# Patient Record
Sex: Female | Born: 1960 | Race: White | Hispanic: No | Smoking: Never smoker
Health system: Southern US, Community
[De-identification: ages and names within clinical notes are randomized; demographics above are authoritative.]

## PROBLEM LIST (undated history)

## (undated) DIAGNOSIS — M1990 Unspecified osteoarthritis, unspecified site: Secondary | ICD-10-CM

## (undated) DIAGNOSIS — F32A Depression, unspecified: Secondary | ICD-10-CM

## (undated) DIAGNOSIS — F329 Major depressive disorder, single episode, unspecified: Secondary | ICD-10-CM

## (undated) DIAGNOSIS — I10 Essential (primary) hypertension: Secondary | ICD-10-CM

## (undated) DIAGNOSIS — F419 Anxiety disorder, unspecified: Secondary | ICD-10-CM

## (undated) DIAGNOSIS — G709 Myoneural disorder, unspecified: Secondary | ICD-10-CM

## (undated) HISTORY — PX: TUBAL LIGATION: SHX77

## (undated) HISTORY — PX: TONSILLECTOMY: SUR1361

## (undated) HISTORY — PX: APPENDECTOMY: SHX54

## (undated) HISTORY — PX: CHOLECYSTECTOMY: SHX55

---

## 2016-05-08 ENCOUNTER — Other Ambulatory Visit: Payer: Self-pay | Admitting: Obstetrics and Gynecology

## 2016-05-08 DIAGNOSIS — Z1231 Encounter for screening mammogram for malignant neoplasm of breast: Secondary | ICD-10-CM

## 2016-05-08 DIAGNOSIS — R928 Other abnormal and inconclusive findings on diagnostic imaging of breast: Secondary | ICD-10-CM

## 2016-05-21 ENCOUNTER — Ambulatory Visit (HOSPITAL_COMMUNITY)
Admission: RE | Admit: 2016-05-21 | Discharge: 2016-05-21 | Disposition: A | Discharge status: Home / Self Care | Payer: Self-pay | Source: Ambulatory Visit | Attending: Obstetrics and Gynecology | Admitting: Obstetrics and Gynecology

## 2016-05-21 ENCOUNTER — Ambulatory Visit
Admission: RE | Admit: 2016-05-21 | Discharge: 2016-05-21 | Disposition: A | Discharge status: Home / Self Care | Payer: No Typology Code available for payment source | Source: Ambulatory Visit | Attending: Obstetrics and Gynecology | Admitting: Obstetrics and Gynecology

## 2016-05-21 VITALS — BP 135/81 | HR 74 | Temp 98.6°F | Ht 66.0 in | Wt 199.4 lb

## 2016-05-21 DIAGNOSIS — Z029 Encounter for administrative examinations, unspecified: Secondary | ICD-10-CM | POA: Insufficient documentation

## 2016-05-21 DIAGNOSIS — Z1239 Encounter for other screening for malignant neoplasm of breast: Secondary | ICD-10-CM

## 2016-05-21 DIAGNOSIS — R87613 High grade squamous intraepithelial lesion on cytologic smear of cervix (HGSIL): Secondary | ICD-10-CM

## 2016-05-21 DIAGNOSIS — Z1231 Encounter for screening mammogram for malignant neoplasm of breast: Secondary | ICD-10-CM

## 2016-05-21 NOTE — Patient Instructions (Signed)
Explained breast self awareness with Satira SarkLaura Goldner. Patient did not need a Pap smear today due to last Pap smear was 04/17/2016. Explained the colposcopy the recommended follow up for patient's abnormal Pap smear. [facility]ps[facility]. Appointment scheduled for Thursday, May 30, 2016 at 1320. [facility]. Appointment scheduled for Tuesday, May 22, 2014 at 1140. Patient aware of colposcopy appointment and will be there or call to reschedule if needed. Let patient know the Breast Center will follow up with her within the next couple weeks with results of mammogram by letter or phone. Satira SarkLaura Kobel verbalized understanding.  Banks Chaikin, Kathaleen Maserhristine Poll, RN 1:00 PM

## 2016-05-21 NOTE — Addendum Note (Signed)
Encounter addended by: Priscille HeidelbergBrannock, Almendra Loria P, RN on: 05/21/2016  1:11 PM<BR>    Actions taken: Allergies modified

## 2016-05-21 NOTE — Progress Notes (Signed)
[facility] due to having an abnormal Pap smear 04/17/2016 that a colposcopy is recommended for follow up.  Pap Smear: Pap smear not completed today. [facility] and [facility]. [facility] for a colpscopy. Appointment scheduled for Thursday, May 30, 2016 at 1320. Per patient has no history of abnormal Pap smears prior to the most recent Pap smear. Last Pap smear result is in EPIC.  Physical exam: Breasts Breasts symmetrical. No skin abnormalities bilateral breasts. No nipple retraction bilateral breasts. No nipple discharge bilateral breasts. No lymphadenopathy. No lumps palpated bilateral breasts. No complaints of pain or tenderness on exam. [facility] screening mamm[facility]. Appointment scheduled for Tuesday, May 22, 2014 at 1140.        Pelvic/Bimanual No Pap smear completed today since last Pap smear was 04/17/2016. Pap smear not indicated per [facility] guidelines.   Smoking History: Patient has never smoked.  Patient Navigation: Patient education provided. Access to services provided for patient through [facility] program.   Colorectal Cancer Screening: Per patient has never had a colonoscopy completed. No complaints today. FIT Test given to patient to complete and return to [facility].

## 2016-05-22 ENCOUNTER — Encounter (HOSPITAL_COMMUNITY): Payer: Self-pay

## 2016-05-22 ENCOUNTER — Encounter (HOSPITAL_COMMUNITY): Payer: Self-pay | Admitting: *Deleted

## 2016-05-22 NOTE — Addendum Note (Signed)
Encounter addended by: Lynnell DikeHolland, Ahlivia Salahuddin H, LPN on: 1/6/10965/09/2016 10:32 AM<BR>    Actions taken: Vitals modified

## 2016-05-30 ENCOUNTER — Ambulatory Visit: Payer: Self-pay | Admitting: Obstetrics and Gynecology

## 2016-07-09 ENCOUNTER — Ambulatory Visit (INDEPENDENT_AMBULATORY_CARE_PROVIDER_SITE_OTHER): Payer: Self-pay | Admitting: Obstetrics & Gynecology

## 2016-07-09 ENCOUNTER — Encounter: Payer: Self-pay | Admitting: Obstetrics & Gynecology

## 2016-07-09 ENCOUNTER — Other Ambulatory Visit (HOSPITAL_COMMUNITY)
Admission: RE | Admit: 2016-07-09 | Discharge: 2016-07-09 | Disposition: A | Discharge status: Home / Self Care | Payer: Medicaid Other | Source: Ambulatory Visit | Attending: Obstetrics & Gynecology | Admitting: Obstetrics & Gynecology

## 2016-07-09 VITALS — BP 150/88 | HR 73 | Ht 67.0 in | Wt 205.6 lb

## 2016-07-09 DIAGNOSIS — D069 Carcinoma in situ of cervix, unspecified: Secondary | ICD-10-CM | POA: Diagnosis not present

## 2016-07-09 DIAGNOSIS — R87613 High grade squamous intraepithelial lesion on cytologic smear of cervix (HGSIL): Secondary | ICD-10-CM

## 2016-07-09 LAB — POCT PREGNANCY, URINE: PREG TEST UR: NEGATIVE

## 2016-07-09 NOTE — Patient Instructions (Signed)
Human Papillomavirus Human papillomavirus (HPV) is the most common sexually transmitted infection (STI). It is easy to pass it from person to person (contagious). HPV can cause cervical cancer, anal cancer, and genital warts. The genital warts can be seen and felt. Also, there may be wartlike regions in the throat. HPV may not have any symptoms. It is possible to have HPV for a long time and not know it. You may pass HPV on to others without knowing it. Follow these instructions at home:  Take medicines as told by your doctor.  Use over-the-counter creams for itching as told by your doctor.  Keep all follow-up visits. Make sure to get Pap tests as told by your doctor.  Do not touch or scratch the warts.  Do not treat genital warts with medicines used for treating hand warts.  Do not have sex while you are getting treatment.  Do not douche or use tampons during treatment of HPV.  Tell your sex partner about your infection because he or she may also need treatment.  If you get pregnant, tell your doctor that you had HPV. Your doctor will watch your pregnancy closely. This is important to keep your baby safe.  After treatment, use condoms during sex to prevent future infections.  Have only one sex partner.  Have a sex partner who does not have other sex partners. Contact a doctor if:  The treated skin is red, swollen, or painful.  You have a fever.  You feel ill.  You feel lumps or pimple-like areas in and around your genital area.  You have bleeding of the vagina or the area that was treated.  You have pain during sex. This information is not intended to replace advice given to you by your health care provider. Make sure you discuss any questions you have with your health care provider. Document Released: 12/14/2007 Document Revised: 06/08/2015 Document Reviewed: 04/07/2013 Elsevier Interactive Patient Education  2017 Elsevier Inc.  

## 2016-07-09 NOTE — Progress Notes (Signed)
   Subjective:    Patient ID: Debra Proctor, female    DOB: May 09, 1960, 56 y.o.   MRN: [id number]  HPI 56 yo W here for a colpo due [facility] She is widowed and has been abstinent for several years. She denies a h/o abnormal paps. Her last pap was "6 or 7 years ago". She is not a smoker.  Review of Systems     Objective:   Physical Exam  WNWHWFNAD Breathing, conversing, and ambulating normally UPT negative, consent signed, time out done Cervix prepped with acetic acid. Transformation zone seen in its entirety. Colpo adequate. She had a small area of punctation and acetowhite changes at the 1 o'clock position. ECC obtained. She tolerated the procedure well.      Assessment & Plan:  HGISL pap c/w colpo today-  RTC in 2 weeks for results

## 2016-07-25 ENCOUNTER — Telehealth: Payer: Self-pay | Admitting: General Practice

## 2016-07-25 NOTE — Telephone Encounter (Signed)
Called patient & informed her of results and explained LEEP procedure. Patient verbalized understanding & had no questions

## 2016-07-25 NOTE — Telephone Encounter (Signed)
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

## 2016-08-02 ENCOUNTER — Ambulatory Visit (INDEPENDENT_AMBULATORY_CARE_PROVIDER_SITE_OTHER): Payer: Self-pay | Admitting: Obstetrics & Gynecology

## 2016-08-02 DIAGNOSIS — D069 Carcinoma in situ of cervix, unspecified: Secondary | ICD-10-CM

## 2016-08-02 NOTE — Progress Notes (Signed)
   Subjective:    Patient ID: Satira SarkLaura Dray, female    DOB: 01-09-1961, 56 y.o.   MRN: [id number]  HPI  56 yo W here for a LEEP due [facility] I did a colpo on 07/09/16 that showed changes c/w CIN2. 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 She is widowed and has been abstinent for several years. She denies a h/o abnormal paps. Her last pap was "6 or 7 years ago". She is not a smoker.   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 RTC for LEEP.

## 2016-08-05 ENCOUNTER — Telehealth: Payer: Self-pay | Admitting: Clinical

## 2016-08-05 NOTE — Telephone Encounter (Signed)
F/u call after positive PHQ9/GAD7 score on 08-02-16; [facility] medical visit on 09-02-16.

## 2016-08-07 ENCOUNTER — Encounter (HOSPITAL_COMMUNITY): Payer: Self-pay | Admitting: *Deleted

## 2016-08-09 ENCOUNTER — Telehealth: Payer: Self-pay | Admitting: Clinical

## 2016-08-09 NOTE — Telephone Encounter (Signed)
Follow-up call, as requested by patient; left HIPPA-compliant message to return call to [place] [facility] at [phone].   Patient needs to call her Medicaid worker to find outcome of Medicaid application, as it is not currently appearing in [facility] system.

## 2016-08-28 ENCOUNTER — Ambulatory Visit: Payer: No Typology Code available for payment source | Admitting: Obstetrics & Gynecology

## 2016-09-02 ENCOUNTER — Ambulatory Visit: Payer: No Typology Code available for payment source | Admitting: Obstetrics & Gynecology

## 2016-09-09 ENCOUNTER — Telehealth (HOSPITAL_COMMUNITY): Payer: Self-pay

## 2016-09-09 NOTE — Telephone Encounter (Signed)
Spoke with patient reminding her about the at home FIT test that was given to her in [place] on 05/21/16. I asked the patient if she had any questions and she said no. Patient did not say if she planned on completing the test.

## 2016-10-09 ENCOUNTER — Ambulatory Visit: Payer: No Typology Code available for payment source | Admitting: Obstetrics & Gynecology

## 2016-10-28 ENCOUNTER — Encounter: Payer: Self-pay | Admitting: Obstetrics & Gynecology

## 2016-10-28 ENCOUNTER — Ambulatory Visit (INDEPENDENT_AMBULATORY_CARE_PROVIDER_SITE_OTHER): Payer: Medicaid Other | Admitting: Clinical

## 2016-10-28 ENCOUNTER — Other Ambulatory Visit (HOSPITAL_COMMUNITY)
Admission: RE | Admit: 2016-10-28 | Discharge: 2016-10-28 | Disposition: A | Discharge status: Home / Self Care | Payer: Medicaid Other | Source: Ambulatory Visit | Attending: Obstetrics & Gynecology | Admitting: Obstetrics & Gynecology

## 2016-10-28 ENCOUNTER — Ambulatory Visit (INDEPENDENT_AMBULATORY_CARE_PROVIDER_SITE_OTHER): Payer: Medicaid Other | Admitting: Obstetrics & Gynecology

## 2016-10-28 VITALS — BP 152/99 | HR 66 | Wt 201.5 lb

## 2016-10-28 DIAGNOSIS — Z1331 Encounter for screening for depression: Secondary | ICD-10-CM

## 2016-10-28 DIAGNOSIS — F4323 Adjustment disorder with mixed anxiety and depressed mood: Secondary | ICD-10-CM

## 2016-10-28 DIAGNOSIS — Z3202 Encounter for pregnancy test, result negative: Secondary | ICD-10-CM | POA: Diagnosis not present

## 2016-10-28 DIAGNOSIS — D069 Carcinoma in situ of cervix, unspecified: Secondary | ICD-10-CM | POA: Insufficient documentation

## 2016-10-28 LAB — POCT PREGNANCY, URINE: Preg Test, Ur: NEGATIVE

## 2016-10-28 NOTE — BH Specialist Note (Signed)
Integrated Behavioral Health Initial Visit  MRN: [id number] Name: Debra Proctor  Number of Integrated Behavioral Health Clinician visits:: 1/6 Session Start time: 2:10  Session End time: 2:30 Total time: 20 minutes  Type of Service: Integrated Behavioral Health- Individual/Family Interpretor:No. Interpretor Name and Language: n/a   Warm Hand Off Completed.       SUBJECTIVE: Debra Proctor is a 56 y.o. female accompanied by n/a Patient was referred by Dr Marice Potter for depression, anxiety. Patient reports the following symptoms/concerns: Pt states her primary concern today is feeling anxious over her procedure today, along with numerous overwhelming life stressors; open to learning additional strategy to cope.  Duration of problem: Increase today; Severity of problem: moderate  OBJECTIVE: Mood: Anxious and Affect: Appropriate Risk of harm to self or others: No plan to harm self or others Pt denies SI, HI; no plan  LIFE CONTEXT: Family and Social: Custody of 2 grandchildren, working on custody of 3rd grandchild, helps care for her elderly mother; has 6 grandchildren. Pt lost her husband, then father, in past two years. School/Work: Denied disability; is working on Soil scientist: Walks around house when anxiety increases Life Changes: working on custody of 3rd grandchild, loss of husband, and caring for elderly mother after father passed away  GOALS ADDRESSED: Patient will: 1. Reduce symptoms of: anxiety, depression and stress 2. Increase knowledge and/or ability of: self-management skills  3. Demonstrate ability to: Increase healthy adjustment to current life circumstances  INTERVENTIONS: Interventions utilized: Mindfulness or Management consultant and Psychoeducation and/or Health Education  Standardized Assessments completed: GAD-7 and PHQ 9  ASSESSMENT: Patient currently experiencing Adjustment disorder with mixed anxiety and depression.   Patient may benefit from  psychoeducation and brief therapeutic intervention regarding coping with symptoms of anxiety with panic attacks and depression, along with establishing with psychiatry for further [facility] med management.  PLAN: 1. Follow up with behavioral health clinician on : As needed 2. Behavioral Recommendations:  -Contact Medicaid worker to send list of psychiatrists in [facility]; consider establishing care with local psychiatrist for [facility] med management and disability case -Consider CALM relaxation breathing exercise every morning, with daily coffee, to help prevent escalation of anxiety -Continue taking short walks when feelings of anxiety increase; consider taking walks outside, as able -Read educational material regarding coping with symptoms of anxiety with panic attacks and depression 3. Referral(s): Integrated Art gallery manager (In Clinic) and [facility] (LME/Outside Clinic) 4. "From scale of 1-10, how likely are you to follow plan?": 8  Rae Lips, LCSWA   Depression screen [facility] 2/9 10/28/2016 08/02/2016  Decreased Interest 1 1  Down, Depressed, Hopeless 1 1  PHQ - 2 Score 2 2  Altered sleeping 2 1  Tired, decreased energy 1 1  Change in appetite 2 3  Feeling bad or failure about yourself  1 2  Trouble concentrating 2 1  Moving slowly or fidgety/restless 1 1  Suicidal thoughts 1 2  PHQ-9 Score 12 13   GAD 7 : Generalized Anxiety Score 10/28/2016 08/02/2016  Nervous, Anxious, on Edge 1 1  Control/stop worrying 1 1  Worry too much - different things 2 1  Trouble relaxing 2 2  Restless 2 1  Easily annoyed or irritable 1 1  Afraid - awful might happen 1 2  Total GAD 7 Score 10 9

## 2016-10-28 NOTE — Progress Notes (Signed)
   Subjective:    Patient ID: Debra Proctor, female    DOB: 22-Oct-1960, 56 y.o.   MRN: [id number]  HPI 56 yo P2 here for a LEEP. 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   Review of Systems     Objective:   Physical Exam  Colpo Biopsy:   Risks, benefits, alternatives, and limitations of procedure explained to patient, including pain, bleeding, infection, failure to remove abnormal tissue and failure to cure dysplasia, need for repeat procedures, damage to pelvic organs, cervical incompetence.  Role of HPV,cervical dysplasia and need for close followup was empasized. Informed written consent was obtained. All questions were answered. Time out performed. Urine pregnancy test was negative.  Procedure: The patient was placed in lithotomy position and the bivalved coated speculum was placed in the patient's vagina. A grounding pad placed on the patient. Acetic acid was applied to the cervix and areas of decreased uptake were noted around the transformation zone.   Local anesthesia was administered via an intracervical block using 20cc of 2% Lidocaine with epinephrine. The suction was turned on and the large radius loop was used to remove a portion of the ectocervix. I then obtained a tophat. I obtained an ECC.  Excellent hemostasis was achieved using roller ball coagulation set at 50 Watts coagulation current and Monsels solution. . The speculum was removed from the vagina. Specimens were sent to pathology.  The patient tolerated the procedure well. Post-operative instructions given to patient, including instruction to seek medical attention for persistent bright red bleeding, fever, abdominal/pelvic pain, dysuria, nausea or vomiting. She was also told about the possibility of having copious yellow to black tinged discharge for weeks. She was counseled to avoid anything in the vagina (sex/douching/tampons) for 3 weeks. She has a 4 week post-operative check to assess wound healing, review results and discuss  further management.         Assessment & Plan:   CIN 3 s/p LEEP. Await pathology

## 2016-11-05 ENCOUNTER — Telehealth: Payer: Self-pay | Admitting: General Practice

## 2016-11-05 NOTE — Telephone Encounter (Signed)
Patient called and left message stating she wants to know when she can take a bath as she has recently had a LEEP. Called patient and discussed that she should wait at least 4 weeks until her follow up appt to allow the area to heal. Told patient she currently does not have a follow up appt scheduled but I will let the front office staff know and they will contact her with an appt. Patient verbalized understanding and asked about pathology results. Informed patient of results but that I wouldn't be able to advise her when a follow up pap is needed but Dr Marice Potterove will cover that at her follow up appt. Patient verbalized understanding & had no questions

## 2016-11-26 ENCOUNTER — Ambulatory Visit: Payer: Self-pay | Admitting: Obstetrics & Gynecology

## 2016-12-13 ENCOUNTER — Ambulatory Visit: Payer: Self-pay | Admitting: Clinical

## 2016-12-13 ENCOUNTER — Ambulatory Visit: Payer: Medicaid Other | Admitting: Obstetrics & Gynecology

## 2016-12-13 ENCOUNTER — Encounter: Payer: Self-pay | Admitting: Obstetrics & Gynecology

## 2016-12-13 VITALS — BP 136/80 | HR 88 | Ht 67.0 in | Wt 196.3 lb

## 2016-12-13 DIAGNOSIS — D069 Carcinoma in situ of cervix, unspecified: Secondary | ICD-10-CM

## 2016-12-13 NOTE — BH Specialist Note (Signed)
Integrated Behavioral Health Follow Up Visit  MRN: [id number] Name: Debra Proctor  Number of Integrated Behavioral Health Clinician visits: 3/6 Session Start time: 9:40  Session End time: 9:55 Total time: 15 minutes  Type of Service: Integrated Behavioral Health- Individual/Family Interpretor:No. Interpretor Name and Language: n/a  SUBJECTIVE: Debra Proctor is a 56 y.o. female accompanied by n/a Patient was referred by Dr Marice Potterove for depression, anxiety. Patient reports the following symptoms/concerns: Pt states that her primary concern today is finding out this past week that her mother has ovarian cancer. Pt denies SI; rather, she says she has been thinking about mortality, as it relates to her mother and other family members experiencing cancer scares. Lack of transportation continues to be an additional life stress. Duration of problem: Less than one week; Severity of problem: severe  OBJECTIVE: Mood: Anxious and Affect: Appropriate Risk of harm to self or others: No plan to harm self or others  LIFE CONTEXT: Family and Social: Pt lives with, and takes care of her mother; her mother is currently in the hospital, and will start chemotherapy on Monday. Other family members have had cancer scares in the past 6 months; family is close and supportive of one another School/Work: In active disability case Self-Care: Relaxation breathing exercised daily; has been teaching them to her mother Life Changes: Her mother was diagnosed with ovarian cancer  GOALS ADDRESSED: Patient will: 1.  Reduce symptoms of: anxiety, depression and stress  2.  Increase knowledge and/or ability of: self-management skills  3.  Demonstrate ability to: Increase healthy adjustment to current life circumstances  INTERVENTIONS: Interventions utilized:  Supportive Counseling and Link to [facility] Standardized Assessments completed: GAD-7 and PHQ 9  ASSESSMENT  Patient currently experiencing Adjustment  disorder with mixed anxious and depressed mood.  Patient may benefit from continued brief therapeutic interventions regarding coping with symptoms of anxiety and depression.  PLAN: 1. Follow up with behavioral health clinician on : Next medical appointment(March), or earlier, if needed 2. Behavioral recommendations:  -Continue doing relaxation breathing exercises daily; with mom, as able -Continue working with disability lawyer -Consider obtaining transportation paperwork through DSS for mothers' upcoming appointments, prior to mother leaving the hospital -Consider psychiatry for [facility] med management and disability case -Consider taking short, daily walks to cope with stress and anxiety 3. Referral(s): Integrated Behavioral Health Services (In Clinic) 4. "From scale of 1-10, how likely are you to follow plan?": 9  Rae LipsJamie C McMannes, LCSW  Depression screen [facility] 2/9 12/13/2016 10/28/2016 08/02/2016  Decreased Interest 3 1 1   Down, Depressed, Hopeless 2 1 1   PHQ - 2 Score 5 2 2   Altered sleeping 2 2 1   Tired, decreased energy 3 1 1   Change in appetite 2 2 3   Feeling bad or failure about yourself  2 1 2   Trouble concentrating 2 2 1   Moving slowly or fidgety/restless 2 1 1   Suicidal thoughts 2 1 2   PHQ-9 Score 20 12 13   **note: pt denies SI GAD 7 : Generalized Anxiety Score 12/13/2016 10/28/2016 08/02/2016  Nervous, Anxious, on Edge 3 1 1   Control/stop worrying 3 1 1   Worry too much - different things 3 2 1   Trouble relaxing 2 2 2   Restless 2 2 1   Easily annoyed or irritable 3 1 1   Afraid - awful might happen 3 1 2   Total GAD 7 Score 19 10 9

## 2016-12-13 NOTE — Progress Notes (Signed)
   Subjective:    Patient ID: Debra Proctor, female    DOB: 1960-12-19, 56 y.o.   MRN: [id number]  HPI 56 yo widowed White P2 (1933 and 54 yo daughters) here for her pathology results. 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    Review of Systems Her mother was recently diagnosed with ovarian cancer (NO other cancers on her maternal side)    Objective:   Physical Exam        Assessment & Plan:  + margins, HGSIL- rec CKC. She is busy until March and agrees to have it done then Stress- see Asher MuirJamie today I have sent Rhea BleacherJacinda an email to schedule CKC in March.

## 2017-01-20 ENCOUNTER — Encounter (HOSPITAL_COMMUNITY): Payer: Self-pay

## 2017-04-04 ENCOUNTER — Encounter (HOSPITAL_BASED_OUTPATIENT_CLINIC_OR_DEPARTMENT_OTHER): Payer: Self-pay | Admitting: *Deleted

## 2017-04-04 ENCOUNTER — Other Ambulatory Visit: Payer: Self-pay

## 2017-04-09 ENCOUNTER — Ambulatory Visit (HOSPITAL_BASED_OUTPATIENT_CLINIC_OR_DEPARTMENT_OTHER)
Admission: RE | Admit: 2017-04-09 | Discharge: 2017-04-09 | Disposition: A | Discharge status: Home / Self Care | Payer: Medicaid Other | Source: Ambulatory Visit | Attending: Obstetrics & Gynecology | Admitting: Obstetrics & Gynecology

## 2017-04-09 ENCOUNTER — Ambulatory Visit (HOSPITAL_BASED_OUTPATIENT_CLINIC_OR_DEPARTMENT_OTHER): Payer: Medicaid Other | Admitting: Anesthesiology

## 2017-04-09 ENCOUNTER — Encounter (HOSPITAL_BASED_OUTPATIENT_CLINIC_OR_DEPARTMENT_OTHER): Payer: Self-pay | Admitting: Certified Registered Nurse Anesthetist

## 2017-04-09 ENCOUNTER — Encounter (HOSPITAL_BASED_OUTPATIENT_CLINIC_OR_DEPARTMENT_OTHER)
Admission: RE | Disposition: A | Discharge status: Home / Self Care | Payer: Self-pay | Source: Ambulatory Visit | Attending: Obstetrics & Gynecology

## 2017-04-09 ENCOUNTER — Other Ambulatory Visit: Payer: Self-pay

## 2017-04-09 DIAGNOSIS — Z791 Long term (current) use of non-steroidal anti-inflammatories (NSAID): Secondary | ICD-10-CM | POA: Diagnosis not present

## 2017-04-09 DIAGNOSIS — Z79899 Other long term (current) drug therapy: Secondary | ICD-10-CM | POA: Insufficient documentation

## 2017-04-09 DIAGNOSIS — F329 Major depressive disorder, single episode, unspecified: Secondary | ICD-10-CM | POA: Insufficient documentation

## 2017-04-09 DIAGNOSIS — G629 Polyneuropathy, unspecified: Secondary | ICD-10-CM | POA: Diagnosis not present

## 2017-04-09 DIAGNOSIS — I10 Essential (primary) hypertension: Secondary | ICD-10-CM | POA: Insufficient documentation

## 2017-04-09 DIAGNOSIS — D069 Carcinoma in situ of cervix, unspecified: Secondary | ICD-10-CM

## 2017-04-09 DIAGNOSIS — N870 Mild cervical dysplasia: Secondary | ICD-10-CM | POA: Insufficient documentation

## 2017-04-09 DIAGNOSIS — F419 Anxiety disorder, unspecified: Secondary | ICD-10-CM | POA: Diagnosis not present

## 2017-04-09 DIAGNOSIS — N814 Uterovaginal prolapse, unspecified: Secondary | ICD-10-CM | POA: Diagnosis not present

## 2017-04-09 HISTORY — PX: CERVICAL CONIZATION W/BX: SHX1330

## 2017-04-09 HISTORY — DX: Essential (primary) hypertension: I10

## 2017-04-09 HISTORY — DX: Major depressive disorder, single episode, unspecified: F32.9

## 2017-04-09 HISTORY — DX: Myoneural disorder, unspecified: G70.9

## 2017-04-09 HISTORY — DX: Unspecified osteoarthritis, unspecified site: M19.90

## 2017-04-09 HISTORY — DX: Anxiety disorder, unspecified: F41.9

## 2017-04-09 HISTORY — DX: Depression, unspecified: F32.A

## 2017-04-09 LAB — POCT I-STAT, CHEM 8
BUN: 11 mg/dL (ref 6–20)
CREATININE: 0.7 mg/dL (ref 0.44–1.00)
Calcium, Ion: 0.91 mmol/L — ABNORMAL LOW (ref 1.15–1.40)
Chloride: 105 mmol/L (ref 101–111)
GLUCOSE: 103 mg/dL — AB (ref 65–99)
HEMATOCRIT: 39 % (ref 36.0–46.0)
HEMOGLOBIN: 13.3 g/dL (ref 12.0–15.0)
Potassium: 4 mmol/L (ref 3.5–5.1)
Sodium: 138 mmol/L (ref 135–145)
TCO2: 26 mmol/L (ref 22–32)

## 2017-04-09 SURGERY — CONE BIOPSY, CERVIX
Anesthesia: General | Site: Vagina

## 2017-04-09 MED ORDER — OXYCODONE HCL 5 MG PO TABS: 5.0000 mg | ORAL_TABLET | Freq: Once | ORAL | Status: DC | PRN

## 2017-04-09 MED ORDER — MIDAZOLAM HCL 2 MG/2ML IJ SOLN: 1.0000 mg | INTRAMUSCULAR | Status: DC | PRN

## 2017-04-09 MED ORDER — BUPIVACAINE HCL (PF) 0.5 % IJ SOLN
INTRAMUSCULAR | Status: AC
  Filled 2017-04-09: qty 30

## 2017-04-09 MED ORDER — FENTANYL CITRATE (PF) 100 MCG/2ML IJ SOLN
INTRAMUSCULAR | Status: AC
  Filled 2017-04-09: qty 2

## 2017-04-09 MED ORDER — MIDAZOLAM HCL 5 MG/5ML IJ SOLN
INTRAMUSCULAR | Status: DC | PRN
  Administered 2017-04-09: 2 mg via INTRAVENOUS

## 2017-04-09 MED ORDER — PROMETHAZINE HCL 25 MG/ML IJ SOLN: 6.2500 mg | INTRAMUSCULAR | Status: DC | PRN

## 2017-04-09 MED ORDER — SCOPOLAMINE 1 MG/3DAYS TD PT72: 1.0000 | MEDICATED_PATCH | Freq: Once | TRANSDERMAL | Status: DC | PRN

## 2017-04-09 MED ORDER — HYDROMORPHONE HCL 1 MG/ML IJ SOLN: 0.2500 mg | INTRAMUSCULAR | Status: DC | PRN

## 2017-04-09 MED ORDER — LACTATED RINGERS IV SOLN
INTRAVENOUS | Status: DC
  Administered 2017-04-09: 07:00:00 via INTRAVENOUS

## 2017-04-09 MED ORDER — MIDAZOLAM HCL 2 MG/2ML IJ SOLN
INTRAMUSCULAR | Status: AC
  Filled 2017-04-09: qty 2

## 2017-04-09 MED ORDER — OXYCODONE HCL 5 MG/5ML PO SOLN: 5.0000 mg | Freq: Once | ORAL | Status: DC | PRN

## 2017-04-09 MED ORDER — OXYCODONE-ACETAMINOPHEN 5-325 MG PO TABS: 1.0000 | ORAL_TABLET | Freq: Four times a day (QID) | ORAL | 0 refills | Status: AC | PRN

## 2017-04-09 MED ORDER — DEXAMETHASONE SODIUM PHOSPHATE 10 MG/ML IJ SOLN
INTRAMUSCULAR | Status: AC
  Filled 2017-04-09: qty 1

## 2017-04-09 MED ORDER — DEXAMETHASONE SODIUM PHOSPHATE 10 MG/ML IJ SOLN
INTRAMUSCULAR | Status: DC | PRN
  Administered 2017-04-09: 10 mg via INTRAVENOUS

## 2017-04-09 MED ORDER — ONDANSETRON HCL 4 MG/2ML IJ SOLN
INTRAMUSCULAR | Status: AC
  Filled 2017-04-09: qty 2

## 2017-04-09 MED ORDER — FENTANYL CITRATE (PF) 100 MCG/2ML IJ SOLN
INTRAMUSCULAR | Status: DC | PRN
  Administered 2017-04-09: 25 ug via INTRAVENOUS

## 2017-04-09 MED ORDER — ONDANSETRON HCL 4 MG/2ML IJ SOLN
INTRAMUSCULAR | Status: DC | PRN
  Administered 2017-04-09: 4 mg via INTRAVENOUS

## 2017-04-09 MED ORDER — IODINE STRONG (LUGOLS) 5 % PO SOLN
ORAL | Status: AC
  Filled 2017-04-09: qty 1

## 2017-04-09 MED ORDER — BUPIVACAINE HCL (PF) 0.5 % IJ SOLN
INTRAMUSCULAR | Status: DC | PRN
  Administered 2017-04-09: 25 mL

## 2017-04-09 MED ORDER — FENTANYL CITRATE (PF) 100 MCG/2ML IJ SOLN: 50.0000 ug | INTRAMUSCULAR | Status: DC | PRN

## 2017-04-09 MED ORDER — KETOROLAC TROMETHAMINE 30 MG/ML IJ SOLN
INTRAMUSCULAR | Status: AC
  Filled 2017-04-09: qty 1

## 2017-04-09 MED ORDER — KETOROLAC TROMETHAMINE 30 MG/ML IJ SOLN
INTRAMUSCULAR | Status: DC | PRN
  Administered 2017-04-09: 30 mg via INTRAVENOUS

## 2017-04-09 MED ORDER — LIDOCAINE HCL (CARDIAC) 20 MG/ML IV SOLN
INTRAVENOUS | Status: AC
  Filled 2017-04-09: qty 5

## 2017-04-09 MED ORDER — LIDOCAINE 2% (20 MG/ML) 5 ML SYRINGE
INTRAMUSCULAR | Status: DC | PRN
  Administered 2017-04-09: 60 mg via INTRAVENOUS

## 2017-04-09 MED ORDER — MEPERIDINE HCL 25 MG/ML IJ SOLN: 6.2500 mg | INTRAMUSCULAR | Status: DC | PRN

## 2017-04-09 MED ORDER — IODINE STRONG (LUGOLS) 5 % PO SOLN
ORAL | Status: DC | PRN
  Administered 2017-04-09: 14 mL

## 2017-04-09 MED ORDER — FERRIC SUBSULFATE 259 MG/GM EX SOLN
CUTANEOUS | Status: AC
  Filled 2017-04-09: qty 8

## 2017-04-09 MED ORDER — PROPOFOL 10 MG/ML IV BOLUS
INTRAVENOUS | Status: DC | PRN
  Administered 2017-04-09: 160 mg via INTRAVENOUS

## 2017-04-09 MED ORDER — PROPOFOL 500 MG/50ML IV EMUL
INTRAVENOUS | Status: AC
  Filled 2017-04-09: qty 50

## 2017-04-09 SURGICAL SUPPLY — 31 items
BLADE SURG 11 STRL SS (BLADE) ×3 IMPLANT
BRIEF STRETCH FOR OB PAD XXL (UNDERPADS AND DIAPERS) ×3 IMPLANT
CLOTH BEACON ORANGE TIMEOUT ST (SAFETY) IMPLANT
COUNTER NEEDLE 1200 MAGNETIC (NEEDLE) ×3 IMPLANT
DILATOR CANAL MILEX (MISCELLANEOUS) IMPLANT
ELECT BALL LEEP 5MM RED (ELECTRODE) IMPLANT
ELECT REM PT RETURN 9FT ADLT (ELECTROSURGICAL) ×3
ELECTRODE REM PT RTRN 9FT ADLT (ELECTROSURGICAL) ×1 IMPLANT
GLOVE BIO SURGEON STRL SZ 6.5 (GLOVE) ×2 IMPLANT
GLOVE BIO SURGEONS STRL SZ 6.5 (GLOVE) ×1
GLOVE BIOGEL PI IND STRL 6 (GLOVE) ×1 IMPLANT
GLOVE BIOGEL PI IND STRL 7.0 (GLOVE) ×3 IMPLANT
GLOVE BIOGEL PI INDICATOR 6 (GLOVE) ×2
GLOVE BIOGEL PI INDICATOR 7.0 (GLOVE) ×6
GLOVE SURG SS PI 6.5 STRL IVOR (GLOVE) ×3 IMPLANT
GOWN STRL REUS W/TWL LRG LVL3 (GOWN DISPOSABLE) ×6 IMPLANT
NEEDLE SPNL 18GX3.5 QUINCKE PK (NEEDLE) ×3 IMPLANT
PACK VAGINAL MINOR WOMEN LF (CUSTOM PROCEDURE TRAY) ×3 IMPLANT
PAD OB MATERNITY 4.3X12.25 (PERSONAL CARE ITEMS) ×3 IMPLANT
PAD PREP 24X48 CUFFED NSTRL (MISCELLANEOUS) ×3 IMPLANT
PENCIL BUTTON HOLSTER BLD 10FT (ELECTRODE) ×3 IMPLANT
SCOPETTES 8  STERILE (MISCELLANEOUS) ×2
SCOPETTES 8 STERILE (MISCELLANEOUS) ×1 IMPLANT
SLEEVE SCD COMPRESS KNEE MED (MISCELLANEOUS) ×3 IMPLANT
SPONGE SURGIFOAM ABS GEL 12-7 (HEMOSTASIS) ×3 IMPLANT
SUT VIC AB 0 CT1 27 (SUTURE) ×2
SUT VIC AB 0 CT1 27XBRD ANBCTR (SUTURE) ×1 IMPLANT
TOWEL OR 17X24 6PK STRL BLUE (TOWEL DISPOSABLE) ×3 IMPLANT
TUBING NON-CON 1/4 X 20 CONN (TUBING) ×2 IMPLANT
TUBING NON-CON 1/4 X 20' CONN (TUBING) ×1
YANKAUER SUCT BULB TIP NO VENT (SUCTIONS) ×3 IMPLANT

## 2017-04-09 NOTE — H&P (Signed)
Debra Proctor is an 57 yo widowed White P2 (33 and 934 yo daughters) here for a cone biopsy. 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    No LMP recorded (lmp unknown). Patient is postmenopausal.    Past Medical History:  Diagnosis Date  . Anxiety   . Arthritis    knees  . Depression   . Hypertension   . Neuromuscular disorder (HCC)    peripheral neuropathy    Past Surgical History:  Procedure Laterality Date  . APPENDECTOMY    . CHOLECYSTECTOMY    . TONSILLECTOMY    . TUBAL LIGATION      Family History  Problem Relation Age of Onset  . Breast cancer Other     Social History:  reports that she has never smoked. She has never used smokeless tobacco. She reports that she does not drink alcohol or use drugs.  Allergies:  Allergies  Allergen Reactions  . Penicillins Hives    Medications Prior to Admission  Medication Sig Dispense Refill Last Dose  . celecoxib (CELEBREX) 100 MG capsule Take 100 mg by mouth 2 (two) times daily.   04/03/2017 at Unknown time  . FLUoxetine (PROZAC) 40 MG capsule Take 40 mg by mouth daily.   04/04/2017 at Unknown time  . gabapentin (NEURONTIN) 300 MG capsule Take 600 mg by mouth 3 (three) times daily.    04/04/2017 at Unknown time  . hydrochlorothiazide (MICROZIDE) 12.5 MG capsule Take 12.5 mg by mouth daily.   04/04/2017 at Unknown time  . methocarbamol (ROBAXIN) 500 MG tablet Take 500 mg by mouth 4 (four) times daily.   Past Month at Unknown time  . naproxen (NAPROSYN) 500 MG tablet Take 500 mg by mouth 2 (two) times daily with a meal.   Taking    ROS  Height 5\' 7"  (1.702 m), weight 88.5 kg (195 lb). Physical Exam  Heart- rrr Lungs- CTAB Abd- benign  No results found for this or any previous visit (from the past 24 hour(s)).  No results found.  Assessment/Plan: cin 2&3 on LEEP specimen- plan for CKC  She understands the risks of surgery, including, but not to infection, bleeding, DVTs, damage to bowel, bladder, ureters. She  wishes to proceed.     Debra Proctor 04/09/2017, 6:49 AM

## 2017-04-09 NOTE — Transfer of Care (Signed)
Immediate Anesthesia Transfer of Care Note  Patient: Debra Proctor  Procedure(s) Performed: CONIZATION CERVIX WITH BIOPSY - COLD KNIFE (N/A )  Patient Location: PACU  Anesthesia Type:General  Level of Consciousness: awake, alert  and oriented  Airway & Oxygen Therapy: Patient Spontanous Breathing and Patient connected to face mask oxygen  Post-op Assessment: Report given to RN and Post -op Vital signs reviewed and stable  Post vital signs: Reviewed and stable  Last Vitals:  Vitals Value Taken Time  BP 101/69 04/09/2017  8:06 AM  Temp    Pulse 65 04/09/2017  8:07 AM  Resp 11 04/09/2017  8:07 AM  SpO2 95 % 04/09/2017  8:07 AM  Vitals shown include unvalidated device data.  Last Pain:  Vitals:   04/09/17 0708  TempSrc: Oral  PainSc: 0-No pain      Patients Stated Pain Goal: 3 (04/09/17 0708)  Complications: No apparent anesthesia complications

## 2017-04-09 NOTE — Op Note (Addendum)
04/09/2017  7:58 AM  PATIENT:  Debra Proctor  57 y.o. 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   Delay start of Pharmacological VTE agent (>24hrs) due to surgical blood loss or risk of bleeding: not applicable  The risks, benefits, and alternatives of surgery were explained, understood, and accepted. She was taken to the operating room and placed in the dorsal lithotomy position. Her vagina was prepped and draped in the usual sterile fashion. A timeout was done. A bimanual exam revealed a normal size and shape, anteverted mobile uterus. Nonenlarged adnexa were noted. She has a generous pubic arch, a 2nd degree cystocele and mild uterine descensus.  A weighted speculum was placed in the posterior aspect the vagina and the cervix was visualized. Lugol's solution was generously applied to the cervix. There were no non-staining areas. The anterior lip of the cervix was grasped with a single-tooth tenaculum, and a paracervical block was done with 25 mL of 0.5% Marcaine. A cone shaped specimen of the cervix was removed and endocervical curettings were obtained. The cone bed was cauterized with the Bovie. Hemostasis was noted. A small piece of gelfoam was placed on the cone bed.  A pursestring closure of the cervix was done with a 0 Vicryl suture.  Excellent hemostasis was noted. The instrument sponge, and needle counts were correct. She was taken to the recovery room in stable condition. She tolerated the procedure well.

## 2017-04-09 NOTE — Discharge Instructions (Signed)
No Ibuprofen products until after 2pm today.   Post Anesthesia Home Care Instructions  Activity: Get plenty of rest for the remainder of the day. A responsible individual must stay with you for 24 hours following the procedure.  For the next 24 hours, DO NOT: -Drive a car -Advertising copywriterperate machinery -Drink alcoholic beverages -Take any medication unless instructed by your physician -Make any legal decisions or sign important papers.  Meals: Start with liquid foods such as gelatin or soup. Progress to regular foods as tolerated. Avoid greasy, spicy, heavy foods. If nausea and/or vomiting occur, drink only clear liquids until the nausea and/or vomiting subsides. Call your physician if vomiting continues.  Special Instructions/Symptoms: Your throat may feel dry or sore from the anesthesia or the breathing tube placed in your throat during surgery. If this causes discomfort, gargle with warm salt water. The discomfort should disappear within 24 hours.  If you had a scopolamine patch placed behind your ear for the management of post- operative nausea and/or vomiting:  1. The medication in the patch is effective for 72 hours, after which it should be removed.  Wrap patch in a tissue and discard in the trash. Wash hands thoroughly with soap and water. 2. You may remove the patch earlier than 72 hours if you experience unpleasant side effects which may include dry mouth, dizziness or visual disturbances. 3. Avoid touching the patch. Wash your hands with soap and water after contact with the patch.   Cervical Conization, Care After This sheet gives you information about how to care for yourself after your procedure. Your doctor may also give you more specific instructions. If you have problems or questions, contact your doctor. Follow these instructions at home: Medicines  Take over-the-counter and prescription medicines only as told by your doctor.  Do not take aspirin until your doctor says it is  okay.  If you take pain medicine: ? You may have constipation. To help treat this, your doctor may tell you to:  Drink enough fluid to keep your pee (urine) clear or pale yellow.  Take medicines.  Eat foods that are high in fiber. These include fresh fruits and vegetables, whole grains, bran, and beans.  Limit foods that are high in fat and sugar. These include fried foods and sweet foods. ? Do not drive or use heavy machines. General instructions  You can eat your usual diet unless your doctor tells you not to do so.  Take showers for the first week. Do not take baths, swim, or use hot tubs until your doctor says it is okay.  Do not douche, use tampons, or have sex until your doctor says it is okay.  For 7-14 days after your procedure, avoid: ? Being very active. ? Exercising. ? Heavy lifting.  Keep all follow-up visits as told by your doctor. This is important. Contact a doctor if:  You have a rash.  You are dizzy or lightheaded.  You feel sick to your stomach (nauseous).  You throw up (vomit).  You have fluid from your vagina (vaginal discharge) that smells bad. Get help right away if:  There are blood clots coming from your vagina.  You have more bleeding than you would have in a normal period. For example, you soak a pad in less than 1 hour.  You have a fever.  You have more and more cramps.  You pass out (faint).  You have pain when peeing.  Your have a lot of pain.  Your pain gets  worse.  Your pain does not get better when you take your medicine.  You have blood in your pee.  You throw up (vomit). Summary  After your procedure, take over-the-counter and prescription medicines only as told by your doctor.  Do not douche, use tampons, or have sex until your doctor says it is okay.  For about 7-14 days after your procedure, try not to exercise or lift heavy objects.  Get help right away if you have new symptoms, or if your symptoms become  worse. This information is not intended to replace advice given to you by your health care provider. Make sure you discuss any questions you have with your health care provider. Document Released: 10/10/2007 Document Revised: 01/03/2016 Document Reviewed: 01/03/2016 Elsevier Interactive Patient Education  2017 [employer].

## 2017-04-09 NOTE — Anesthesia Postprocedure Evaluation (Signed)
Anesthesia Post Note  Patient: Debra Proctor  Procedure(s) Performed: CONIZATION CERVIX WITH BIOPSY - COLD KNIFE (N/A Vagina )     Patient location during evaluation: PACU Anesthesia Type: General Level of consciousness: awake and alert Pain management: pain level controlled Vital Signs Assessment: post-procedure vital signs reviewed and stable Respiratory status: spontaneous breathing, nonlabored ventilation and respiratory function stable Cardiovascular status: blood pressure returned to baseline and stable Postop Assessment: no apparent nausea or vomiting Anesthetic complications: no    Last Vitals:  Vitals:   04/09/17 0830 04/09/17 0852  BP: 117/83 122/85  Pulse: 74 69  Resp: 14 18  Temp:  36.6 C  SpO2: 94% 95%    Last Pain:  Vitals:   04/09/17 0852  TempSrc:   PainSc: 4                  Lowella CurbWarren Ray Sharifa Bucholz

## 2017-04-09 NOTE — Anesthesia Preprocedure Evaluation (Signed)
Anesthesia Evaluation  Patient identified by MRN, date of birth, ID band Patient awake    Reviewed: Allergy & Precautions, NPO status , Patient's Chart, lab work & pertinent test results  Airway Mallampati: II  TM Distance: >3 FB Neck ROM: Full    Dental no notable dental hx.    Pulmonary neg pulmonary ROS,    Pulmonary exam normal breath sounds clear to auscultation       Cardiovascular hypertension, negative cardio ROS Normal cardiovascular exam Rhythm:Regular Rate:Normal     Neuro/Psych Anxiety Depression negative neurological ROS  negative psych ROS   GI/Hepatic negative GI ROS, Neg liver ROS,   Endo/Other  negative endocrine ROS  Renal/GU negative Renal ROS  negative genitourinary   Musculoskeletal negative musculoskeletal ROS (+)   Abdominal   Peds negative pediatric ROS (+)  Hematology negative hematology ROS (+)   Anesthesia Other Findings   Reproductive/Obstetrics negative OB ROS                             Anesthesia Physical Anesthesia Plan  ASA: II  Anesthesia Plan: General   Post-op Pain Management:    Induction: Intravenous  PONV Risk Score and Plan: 3 and Ondansetron, Dexamethasone and Midazolam  Airway Management Planned: LMA  Additional Equipment:   Intra-op Plan:   Post-operative Plan: Extubation in OR  Informed Consent: I have reviewed the patients History and Physical, chart, labs and discussed the procedure including the risks, benefits and alternatives for the proposed anesthesia with the patient or authorized representative who has indicated his/her understanding and acceptance.   Dental advisory given  Plan Discussed with: CRNA  Anesthesia Plan Comments:         Anesthesia Quick Evaluation

## 2017-04-09 NOTE — Anesthesia Procedure Notes (Signed)
Procedure Name: LMA Insertion Date/Time: 04/09/2017 7:34 AM Performed by: Pearson Grippeobertson, Raymonda Pell M, CRNA Pre-anesthesia Checklist: Patient identified, Emergency Drugs available, Suction available and Patient being monitored Patient Re-evaluated:Patient Re-evaluated prior to induction Oxygen Delivery Method: Circle system utilized Preoxygenation: Pre-oxygenation with 100% oxygen Induction Type: IV induction Ventilation: Mask ventilation without difficulty LMA: LMA inserted LMA Size: 4.0 Number of attempts: 1 Airway Equipment and Method: Bite block Placement Confirmation: positive ETCO2 Tube secured with: Tape Dental Injury: Teeth and Oropharynx as per pre-operative assessment

## 2017-04-10 ENCOUNTER — Encounter (HOSPITAL_BASED_OUTPATIENT_CLINIC_OR_DEPARTMENT_OTHER): Payer: Self-pay | Admitting: Obstetrics & Gynecology

## 2017-04-21 ENCOUNTER — Telehealth: Payer: Self-pay | Admitting: General Practice

## 2017-04-21 DIAGNOSIS — G8918 Other acute postprocedural pain: Secondary | ICD-10-CM

## 2017-04-21 MED ORDER — IBUPROFEN 800 MG PO TABS: 800.0000 mg | ORAL_TABLET | Freq: Three times a day (TID) | ORAL | 0 refills | Status: AC | PRN

## 2017-04-21 NOTE — Telephone Encounter (Signed)
Patient called and left message on nurse line requesting a refill on her percocet. Called patient stating I am returning her phone call. Discussed with patient we generally do not refill the percocet after surgery especially 2 weeks out. Asked patient if there was any reason/medical condition where she couldn't take ibuprofen. Patient states no. Told patient we will send in Rx strength ibuprofen to her pharmacy. Patient verbalized understanding & had no questions

## 2017-06-16 ENCOUNTER — Telehealth: Payer: Self-pay | Admitting: *Deleted

## 2017-06-16 NOTE — Telephone Encounter (Signed)
[name] called today and left a message taht [facility] States she was supposed to have a 6 week followup but couldn't come because she had other things she  Had to do.

## 2017-06-20 NOTE — Telephone Encounter (Signed)
Called patient and she states she would like to know what her results were from the surgery and if she needs to do anything. Patient states she hasn't been able to come in to the office because she has a lot going on with her mother right now. Told patient I will have to ask Dr Marice Potterove and call her back a different day. Patient verbalized understanding and will await our phone call.

## 2017-06-23 NOTE — Telephone Encounter (Signed)
I returned her call. She denies any post op problems and says that she cannot get here for a post op visit. I have given her pathology result and have recommended a repeat pap in a year.

## 2018-07-07 IMAGING — MG DIGITAL SCREENING BILATERAL MAMMOGRAM WITH CAD
4 series · 4 of 4 positions shown · non-contrast
Comparison: None.

CLINICAL DATA: Screening.

EXAM:
DIGITAL SCREENING BILATERAL MAMMOGRAM WITH CAD

[R MLO]
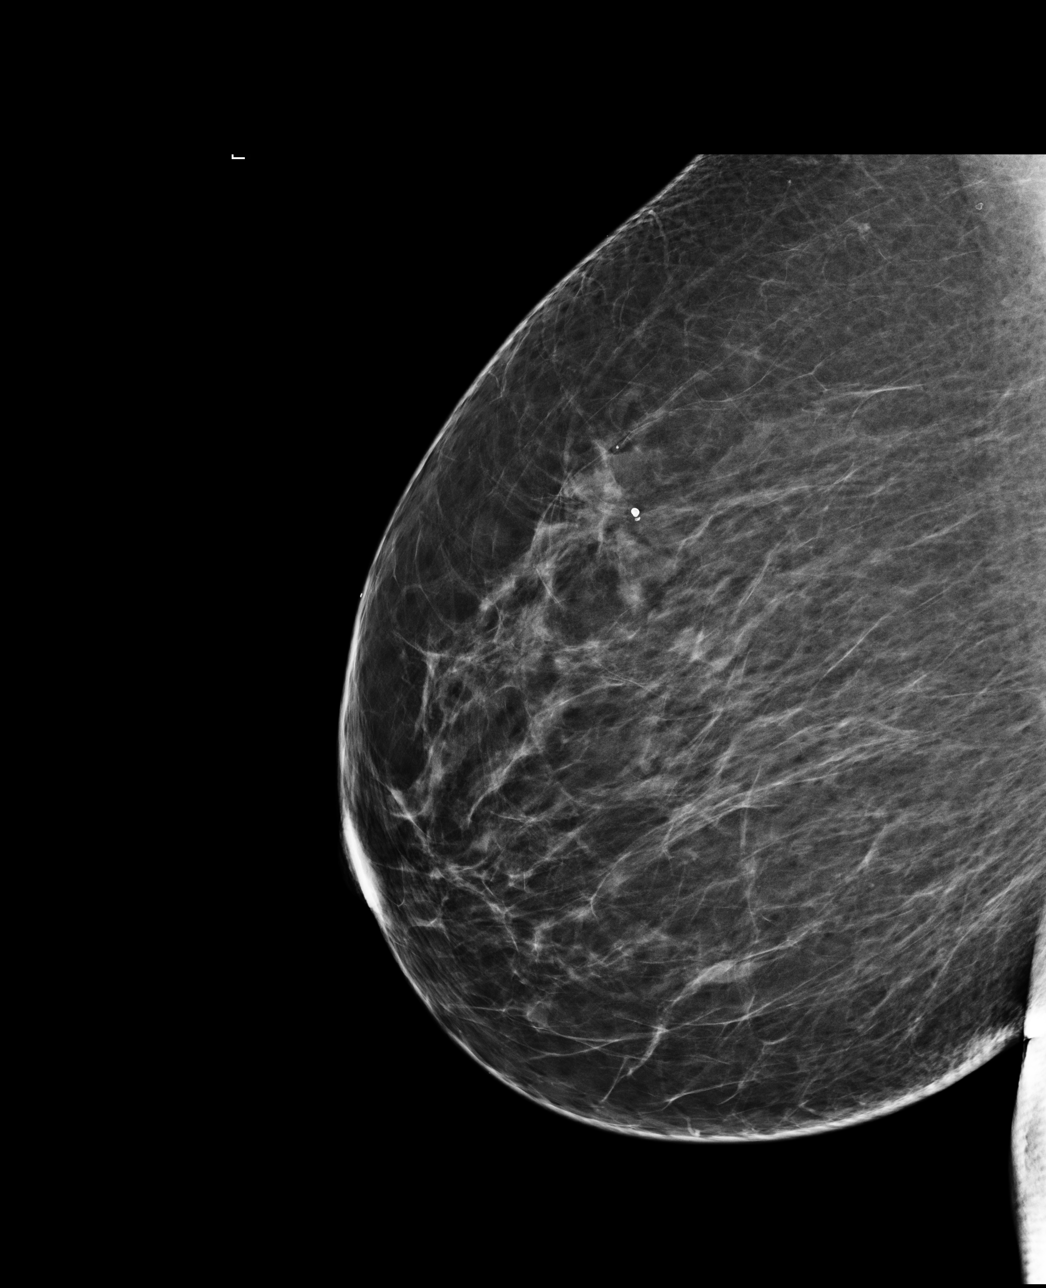

[L CC]
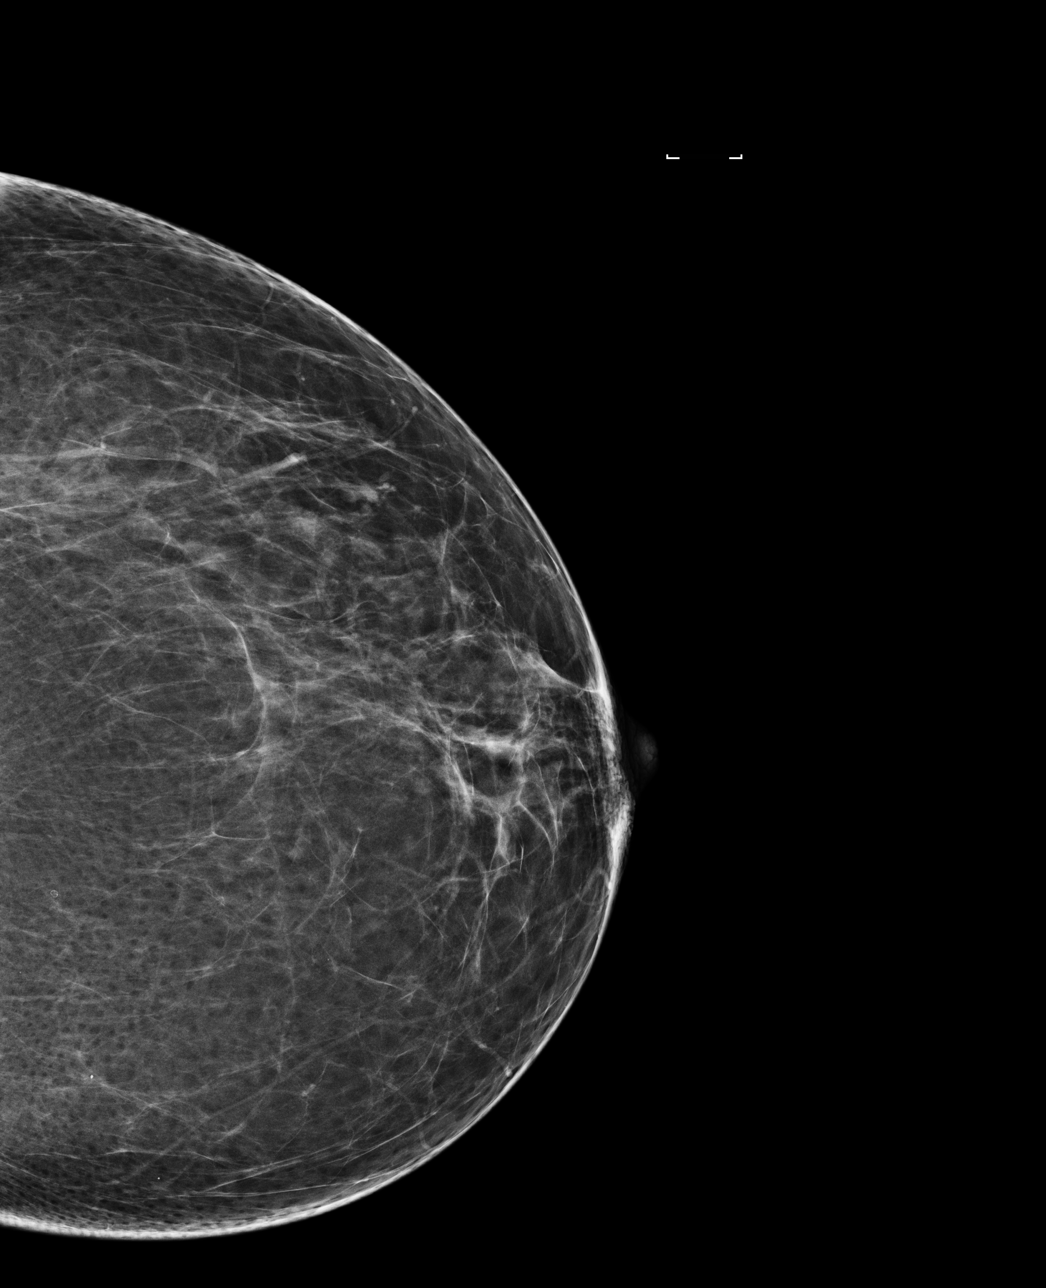

[R CC]
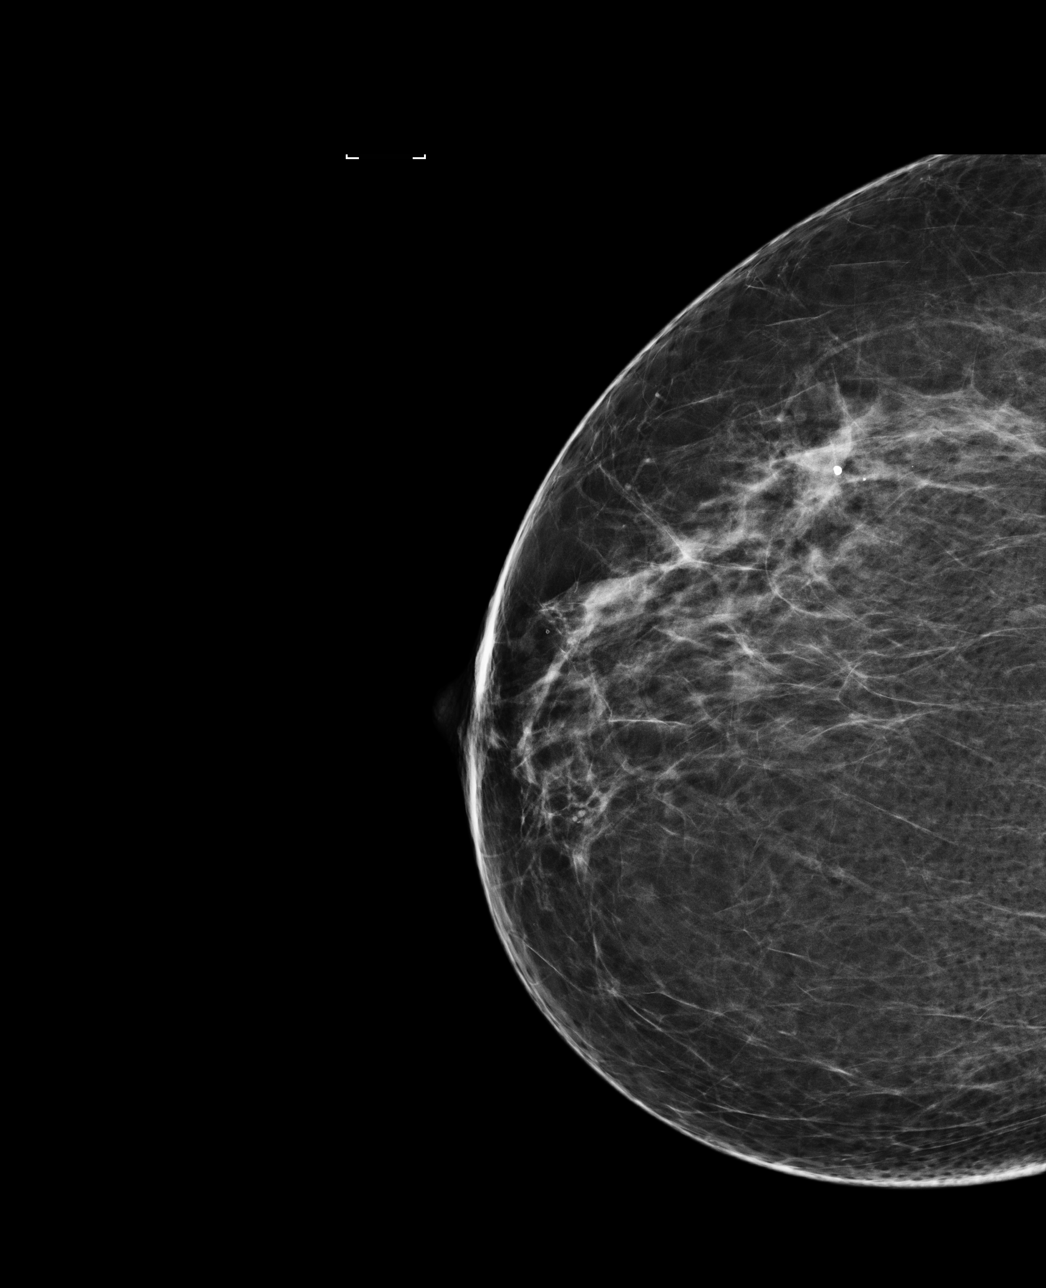

[L MLO]
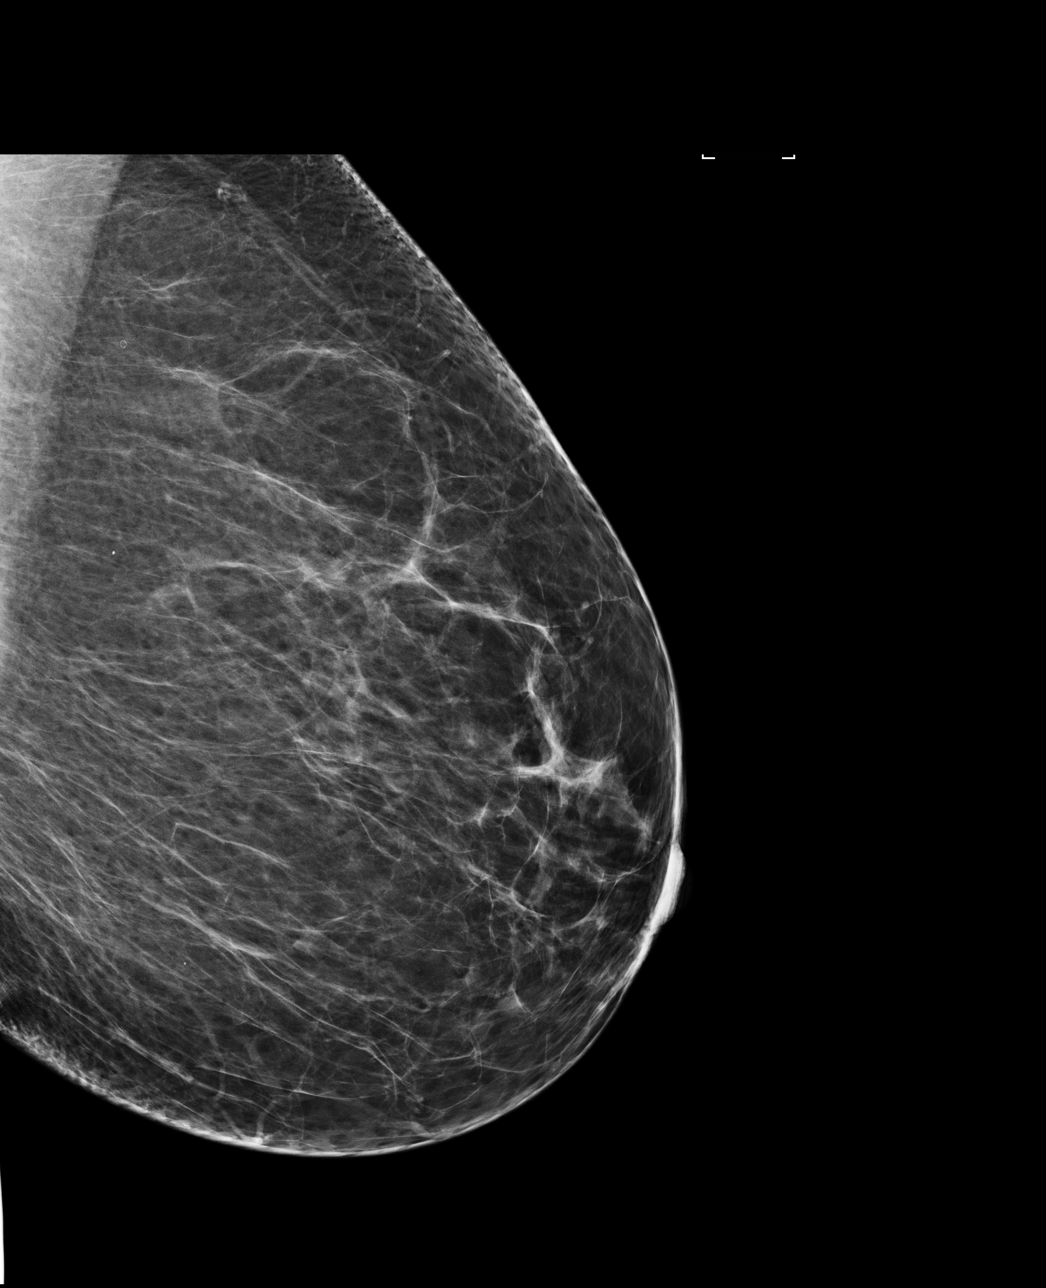

[4 of 4 positions shown; findings below may reference images not displayed]

ACR Breast Density Category b: There are scattered areas of
fibroglandular density.
FINDINGS: There are no findings suspicious for malignancy. Images were
processed with CAD.
IMPRESSION: No mammographic evidence of malignancy. A result letter of this
screening mammogram will be mailed directly to the patient.

RECOMMENDATION:
Screening mammogram in one year. (Code:[id number])

BI-RADS CATEGORY  1: Negative.

## 2021-10-02 ENCOUNTER — Telehealth: Payer: Self-pay

## 2021-10-02 NOTE — Telephone Encounter (Signed)
Phone call attempts times two to arrange surgical consult for high risk breast lesion. Message left requesting call back.

## 2022-01-16 DIAGNOSIS — H00035 Abscess of left lower eyelid: Secondary | ICD-10-CM | POA: Diagnosis not present

## 2022-01-16 DIAGNOSIS — Z6829 Body mass index (BMI) 29.0-29.9, adult: Secondary | ICD-10-CM | POA: Diagnosis not present

## 2022-01-23 DIAGNOSIS — Z6829 Body mass index (BMI) 29.0-29.9, adult: Secondary | ICD-10-CM | POA: Diagnosis not present

## 2022-01-23 DIAGNOSIS — H00035 Abscess of left lower eyelid: Secondary | ICD-10-CM | POA: Diagnosis not present

## 2022-01-28 DIAGNOSIS — R928 Other abnormal and inconclusive findings on diagnostic imaging of breast: Secondary | ICD-10-CM | POA: Diagnosis not present

## 2022-01-28 DIAGNOSIS — R102 Pelvic and perineal pain: Secondary | ICD-10-CM | POA: Diagnosis not present

## 2022-01-28 DIAGNOSIS — R140 Abdominal distension (gaseous): Secondary | ICD-10-CM | POA: Diagnosis not present

## 2022-01-28 DIAGNOSIS — N854 Malposition of uterus: Secondary | ICD-10-CM | POA: Diagnosis not present

## 2022-01-28 DIAGNOSIS — N641 Fat necrosis of breast: Secondary | ICD-10-CM | POA: Diagnosis not present

## 2022-03-06 DIAGNOSIS — E785 Hyperlipidemia, unspecified: Secondary | ICD-10-CM | POA: Diagnosis not present

## 2022-03-06 DIAGNOSIS — R69 Illness, unspecified: Secondary | ICD-10-CM | POA: Diagnosis not present

## 2022-03-06 DIAGNOSIS — L508 Other urticaria: Secondary | ICD-10-CM | POA: Diagnosis not present

## 2022-03-06 DIAGNOSIS — Z6829 Body mass index (BMI) 29.0-29.9, adult: Secondary | ICD-10-CM | POA: Diagnosis not present

## 2022-03-06 DIAGNOSIS — G629 Polyneuropathy, unspecified: Secondary | ICD-10-CM | POA: Diagnosis not present

## 2022-03-06 DIAGNOSIS — E538 Deficiency of other specified B group vitamins: Secondary | ICD-10-CM | POA: Diagnosis not present

## 2022-03-06 DIAGNOSIS — E559 Vitamin D deficiency, unspecified: Secondary | ICD-10-CM | POA: Diagnosis not present

## 2022-03-06 DIAGNOSIS — I10 Essential (primary) hypertension: Secondary | ICD-10-CM | POA: Diagnosis not present

## 2022-03-06 DIAGNOSIS — N840 Polyp of corpus uteri: Secondary | ICD-10-CM | POA: Diagnosis not present

## 2022-03-21 DIAGNOSIS — E538 Deficiency of other specified B group vitamins: Secondary | ICD-10-CM | POA: Diagnosis not present

## 2022-03-28 DIAGNOSIS — N840 Polyp of corpus uteri: Secondary | ICD-10-CM | POA: Diagnosis not present

## 2022-03-28 DIAGNOSIS — Z124 Encounter for screening for malignant neoplasm of cervix: Secondary | ICD-10-CM | POA: Diagnosis not present

## 2022-04-24 DIAGNOSIS — N840 Polyp of corpus uteri: Secondary | ICD-10-CM | POA: Diagnosis not present

## 2022-05-10 DIAGNOSIS — N9971 Accidental puncture and laceration of a genitourinary system organ or structure during a genitourinary system procedure: Secondary | ICD-10-CM | POA: Diagnosis not present

## 2022-05-10 DIAGNOSIS — I10 Essential (primary) hypertension: Secondary | ICD-10-CM | POA: Diagnosis not present

## 2022-05-10 DIAGNOSIS — Z9889 Other specified postprocedural states: Secondary | ICD-10-CM | POA: Diagnosis not present

## 2022-05-10 DIAGNOSIS — Z791 Long term (current) use of non-steroidal anti-inflammatories (NSAID): Secondary | ICD-10-CM | POA: Diagnosis not present

## 2022-05-10 DIAGNOSIS — Z539 Procedure and treatment not carried out, unspecified reason: Secondary | ICD-10-CM | POA: Diagnosis not present

## 2022-05-10 DIAGNOSIS — Z87891 Personal history of nicotine dependence: Secondary | ICD-10-CM | POA: Diagnosis not present

## 2022-05-10 DIAGNOSIS — K219 Gastro-esophageal reflux disease without esophagitis: Secondary | ICD-10-CM | POA: Diagnosis not present

## 2022-05-10 DIAGNOSIS — N840 Polyp of corpus uteri: Secondary | ICD-10-CM | POA: Diagnosis not present

## 2022-05-10 DIAGNOSIS — N882 Stricture and stenosis of cervix uteri: Secondary | ICD-10-CM | POA: Diagnosis not present

## 2022-05-23 DIAGNOSIS — R928 Other abnormal and inconclusive findings on diagnostic imaging of breast: Secondary | ICD-10-CM | POA: Diagnosis not present

## 2022-05-23 DIAGNOSIS — N6311 Unspecified lump in the right breast, upper outer quadrant: Secondary | ICD-10-CM | POA: Diagnosis not present

## 2022-05-23 DIAGNOSIS — N6313 Unspecified lump in the right breast, lower outer quadrant: Secondary | ICD-10-CM | POA: Diagnosis not present

## 2022-05-27 DIAGNOSIS — N9489 Other specified conditions associated with female genital organs and menstrual cycle: Secondary | ICD-10-CM | POA: Diagnosis not present

## 2022-05-27 DIAGNOSIS — Z9889 Other specified postprocedural states: Secondary | ICD-10-CM | POA: Diagnosis not present

## 2022-06-11 DIAGNOSIS — Z8041 Family history of malignant neoplasm of ovary: Secondary | ICD-10-CM | POA: Diagnosis not present

## 2022-06-11 DIAGNOSIS — N9489 Other specified conditions associated with female genital organs and menstrual cycle: Secondary | ICD-10-CM | POA: Diagnosis not present

## 2022-06-11 DIAGNOSIS — Z87410 Personal history of cervical dysplasia: Secondary | ICD-10-CM | POA: Diagnosis not present

## 2022-06-11 DIAGNOSIS — R6889 Other general symptoms and signs: Secondary | ICD-10-CM | POA: Diagnosis not present

## 2022-06-11 DIAGNOSIS — Z5320 Procedure and treatment not carried out because of patient's decision for unspecified reasons: Secondary | ICD-10-CM | POA: Diagnosis not present

## 2022-06-11 DIAGNOSIS — Z8619 Personal history of other infectious and parasitic diseases: Secondary | ICD-10-CM | POA: Diagnosis not present

## 2022-06-18 DIAGNOSIS — Z01810 Encounter for preprocedural cardiovascular examination: Secondary | ICD-10-CM | POA: Diagnosis not present

## 2022-06-18 DIAGNOSIS — R1909 Other intra-abdominal and pelvic swelling, mass and lump: Secondary | ICD-10-CM | POA: Diagnosis not present

## 2022-06-18 DIAGNOSIS — Z79899 Other long term (current) drug therapy: Secondary | ICD-10-CM | POA: Diagnosis not present

## 2022-06-18 DIAGNOSIS — Z01818 Encounter for other preprocedural examination: Secondary | ICD-10-CM | POA: Diagnosis not present

## 2022-06-18 DIAGNOSIS — Z8041 Family history of malignant neoplasm of ovary: Secondary | ICD-10-CM | POA: Diagnosis not present

## 2022-06-18 DIAGNOSIS — Z87410 Personal history of cervical dysplasia: Secondary | ICD-10-CM | POA: Diagnosis not present

## 2022-07-01 DIAGNOSIS — N9971 Accidental puncture and laceration of a genitourinary system organ or structure during a genitourinary system procedure: Secondary | ICD-10-CM | POA: Diagnosis not present

## 2022-07-01 DIAGNOSIS — Z8041 Family history of malignant neoplasm of ovary: Secondary | ICD-10-CM | POA: Diagnosis not present

## 2022-07-01 DIAGNOSIS — R9389 Abnormal findings on diagnostic imaging of other specified body structures: Secondary | ICD-10-CM | POA: Diagnosis not present

## 2022-07-01 DIAGNOSIS — N9489 Other specified conditions associated with female genital organs and menstrual cycle: Secondary | ICD-10-CM | POA: Diagnosis not present

## 2022-07-01 DIAGNOSIS — Z8619 Personal history of other infectious and parasitic diseases: Secondary | ICD-10-CM | POA: Diagnosis not present

## 2022-07-01 DIAGNOSIS — N882 Stricture and stenosis of cervix uteri: Secondary | ICD-10-CM | POA: Diagnosis not present

## 2022-07-01 DIAGNOSIS — Z87410 Personal history of cervical dysplasia: Secondary | ICD-10-CM | POA: Diagnosis not present

## 2022-07-09 DIAGNOSIS — G629 Polyneuropathy, unspecified: Secondary | ICD-10-CM | POA: Diagnosis not present

## 2022-07-09 DIAGNOSIS — F419 Anxiety disorder, unspecified: Secondary | ICD-10-CM | POA: Diagnosis not present

## 2022-07-09 DIAGNOSIS — I10 Essential (primary) hypertension: Secondary | ICD-10-CM | POA: Diagnosis not present

## 2022-07-09 DIAGNOSIS — Z6829 Body mass index (BMI) 29.0-29.9, adult: Secondary | ICD-10-CM | POA: Diagnosis not present

## 2022-07-09 DIAGNOSIS — E785 Hyperlipidemia, unspecified: Secondary | ICD-10-CM | POA: Diagnosis not present

## 2022-07-09 DIAGNOSIS — E559 Vitamin D deficiency, unspecified: Secondary | ICD-10-CM | POA: Diagnosis not present

## 2022-07-09 DIAGNOSIS — B001 Herpesviral vesicular dermatitis: Secondary | ICD-10-CM | POA: Diagnosis not present

## 2022-07-09 DIAGNOSIS — Z1331 Encounter for screening for depression: Secondary | ICD-10-CM | POA: Diagnosis not present

## 2022-07-09 DIAGNOSIS — R928 Other abnormal and inconclusive findings on diagnostic imaging of breast: Secondary | ICD-10-CM | POA: Diagnosis not present

## 2022-07-23 DIAGNOSIS — N858 Other specified noninflammatory disorders of uterus: Secondary | ICD-10-CM | POA: Diagnosis not present

## 2022-07-23 DIAGNOSIS — N882 Stricture and stenosis of cervix uteri: Secondary | ICD-10-CM | POA: Diagnosis not present

## 2022-08-12 DIAGNOSIS — R109 Unspecified abdominal pain: Secondary | ICD-10-CM | POA: Diagnosis not present

## 2022-08-12 DIAGNOSIS — N858 Other specified noninflammatory disorders of uterus: Secondary | ICD-10-CM | POA: Diagnosis not present

## 2022-08-27 DIAGNOSIS — N858 Other specified noninflammatory disorders of uterus: Secondary | ICD-10-CM | POA: Diagnosis not present

## 2022-09-02 DIAGNOSIS — Z79899 Other long term (current) drug therapy: Secondary | ICD-10-CM | POA: Diagnosis not present

## 2022-09-02 DIAGNOSIS — N83201 Unspecified ovarian cyst, right side: Secondary | ICD-10-CM | POA: Diagnosis not present

## 2022-09-02 DIAGNOSIS — N858 Other specified noninflammatory disorders of uterus: Secondary | ICD-10-CM | POA: Diagnosis not present

## 2022-09-02 DIAGNOSIS — I10 Essential (primary) hypertension: Secondary | ICD-10-CM | POA: Diagnosis not present

## 2022-09-02 DIAGNOSIS — N83291 Other ovarian cyst, right side: Secondary | ICD-10-CM | POA: Diagnosis not present

## 2022-09-02 DIAGNOSIS — N72 Inflammatory disease of cervix uteri: Secondary | ICD-10-CM | POA: Diagnosis not present

## 2022-09-02 DIAGNOSIS — N840 Polyp of corpus uteri: Secondary | ICD-10-CM | POA: Diagnosis not present

## 2022-09-02 DIAGNOSIS — N882 Stricture and stenosis of cervix uteri: Secondary | ICD-10-CM | POA: Diagnosis not present

## 2022-09-02 DIAGNOSIS — K660 Peritoneal adhesions (postprocedural) (postinfection): Secondary | ICD-10-CM | POA: Diagnosis not present

## 2022-09-02 DIAGNOSIS — Z9049 Acquired absence of other specified parts of digestive tract: Secondary | ICD-10-CM | POA: Diagnosis not present

## 2022-09-10 DIAGNOSIS — N840 Polyp of corpus uteri: Secondary | ICD-10-CM | POA: Diagnosis not present

## 2022-09-10 DIAGNOSIS — Z1211 Encounter for screening for malignant neoplasm of colon: Secondary | ICD-10-CM | POA: Diagnosis not present

## 2022-10-10 DIAGNOSIS — I10 Essential (primary) hypertension: Secondary | ICD-10-CM | POA: Diagnosis not present

## 2022-10-10 DIAGNOSIS — E559 Vitamin D deficiency, unspecified: Secondary | ICD-10-CM | POA: Diagnosis not present

## 2022-10-10 DIAGNOSIS — K219 Gastro-esophageal reflux disease without esophagitis: Secondary | ICD-10-CM | POA: Diagnosis not present

## 2022-10-10 DIAGNOSIS — Z6828 Body mass index (BMI) 28.0-28.9, adult: Secondary | ICD-10-CM | POA: Diagnosis not present

## 2022-10-10 DIAGNOSIS — F419 Anxiety disorder, unspecified: Secondary | ICD-10-CM | POA: Diagnosis not present

## 2022-10-10 DIAGNOSIS — E785 Hyperlipidemia, unspecified: Secondary | ICD-10-CM | POA: Diagnosis not present

## 2022-10-10 DIAGNOSIS — E538 Deficiency of other specified B group vitamins: Secondary | ICD-10-CM | POA: Diagnosis not present

## 2022-10-31 DIAGNOSIS — N6311 Unspecified lump in the right breast, upper outer quadrant: Secondary | ICD-10-CM | POA: Diagnosis not present

## 2022-10-31 DIAGNOSIS — R928 Other abnormal and inconclusive findings on diagnostic imaging of breast: Secondary | ICD-10-CM | POA: Diagnosis not present

## 2022-10-31 DIAGNOSIS — N6313 Unspecified lump in the right breast, lower outer quadrant: Secondary | ICD-10-CM | POA: Diagnosis not present

## 2022-10-31 DIAGNOSIS — R92323 Mammographic fibroglandular density, bilateral breasts: Secondary | ICD-10-CM | POA: Diagnosis not present

## 2022-12-10 DIAGNOSIS — K219 Gastro-esophageal reflux disease without esophagitis: Secondary | ICD-10-CM | POA: Diagnosis not present

## 2022-12-10 DIAGNOSIS — I10 Essential (primary) hypertension: Secondary | ICD-10-CM | POA: Diagnosis not present

## 2022-12-10 DIAGNOSIS — Z880 Allergy status to penicillin: Secondary | ICD-10-CM | POA: Diagnosis not present

## 2022-12-10 DIAGNOSIS — Z809 Family history of malignant neoplasm, unspecified: Secondary | ICD-10-CM | POA: Diagnosis not present

## 2022-12-10 DIAGNOSIS — Z87892 Personal history of anaphylaxis: Secondary | ICD-10-CM | POA: Diagnosis not present

## 2022-12-10 DIAGNOSIS — F411 Generalized anxiety disorder: Secondary | ICD-10-CM | POA: Diagnosis not present

## 2022-12-10 DIAGNOSIS — Z87891 Personal history of nicotine dependence: Secondary | ICD-10-CM | POA: Diagnosis not present

## 2022-12-10 DIAGNOSIS — Z90710 Acquired absence of both cervix and uterus: Secondary | ICD-10-CM | POA: Diagnosis not present

## 2022-12-10 DIAGNOSIS — E559 Vitamin D deficiency, unspecified: Secondary | ICD-10-CM | POA: Diagnosis not present

## 2022-12-10 DIAGNOSIS — F324 Major depressive disorder, single episode, in partial remission: Secondary | ICD-10-CM | POA: Diagnosis not present

## 2022-12-10 DIAGNOSIS — G629 Polyneuropathy, unspecified: Secondary | ICD-10-CM | POA: Diagnosis not present

## 2023-01-16 DIAGNOSIS — E559 Vitamin D deficiency, unspecified: Secondary | ICD-10-CM | POA: Diagnosis not present

## 2023-01-16 DIAGNOSIS — F419 Anxiety disorder, unspecified: Secondary | ICD-10-CM | POA: Diagnosis not present

## 2023-01-16 DIAGNOSIS — L209 Atopic dermatitis, unspecified: Secondary | ICD-10-CM | POA: Diagnosis not present

## 2023-01-16 DIAGNOSIS — E785 Hyperlipidemia, unspecified: Secondary | ICD-10-CM | POA: Diagnosis not present

## 2023-01-16 DIAGNOSIS — G629 Polyneuropathy, unspecified: Secondary | ICD-10-CM | POA: Diagnosis not present

## 2023-01-16 DIAGNOSIS — I10 Essential (primary) hypertension: Secondary | ICD-10-CM | POA: Diagnosis not present

## 2023-04-16 DIAGNOSIS — Z9181 History of falling: Secondary | ICD-10-CM | POA: Diagnosis not present

## 2023-04-16 DIAGNOSIS — Z0000 Encounter for general adult medical examination without abnormal findings: Secondary | ICD-10-CM | POA: Diagnosis not present

## 2023-04-29 DIAGNOSIS — F419 Anxiety disorder, unspecified: Secondary | ICD-10-CM | POA: Diagnosis not present

## 2023-04-29 DIAGNOSIS — E538 Deficiency of other specified B group vitamins: Secondary | ICD-10-CM | POA: Diagnosis not present

## 2023-04-29 DIAGNOSIS — E785 Hyperlipidemia, unspecified: Secondary | ICD-10-CM | POA: Diagnosis not present

## 2023-04-29 DIAGNOSIS — N611 Abscess of the breast and nipple: Secondary | ICD-10-CM | POA: Diagnosis not present

## 2023-04-29 DIAGNOSIS — E559 Vitamin D deficiency, unspecified: Secondary | ICD-10-CM | POA: Diagnosis not present

## 2023-04-29 DIAGNOSIS — G629 Polyneuropathy, unspecified: Secondary | ICD-10-CM | POA: Diagnosis not present

## 2023-04-29 DIAGNOSIS — I10 Essential (primary) hypertension: Secondary | ICD-10-CM | POA: Diagnosis not present

## 2023-04-29 DIAGNOSIS — Z6827 Body mass index (BMI) 27.0-27.9, adult: Secondary | ICD-10-CM | POA: Diagnosis not present

## 2023-04-29 DIAGNOSIS — L508 Other urticaria: Secondary | ICD-10-CM | POA: Diagnosis not present

## 2023-04-29 DIAGNOSIS — K219 Gastro-esophageal reflux disease without esophagitis: Secondary | ICD-10-CM | POA: Diagnosis not present

## 2023-05-06 DIAGNOSIS — N611 Abscess of the breast and nipple: Secondary | ICD-10-CM | POA: Diagnosis not present

## 2023-06-17 DIAGNOSIS — N611 Abscess of the breast and nipple: Secondary | ICD-10-CM | POA: Diagnosis not present

## 2023-06-18 DIAGNOSIS — Z809 Family history of malignant neoplasm, unspecified: Secondary | ICD-10-CM | POA: Diagnosis not present

## 2023-06-18 DIAGNOSIS — F329 Major depressive disorder, single episode, unspecified: Secondary | ICD-10-CM | POA: Diagnosis not present

## 2023-06-18 DIAGNOSIS — F411 Generalized anxiety disorder: Secondary | ICD-10-CM | POA: Diagnosis not present

## 2023-06-18 DIAGNOSIS — Z87891 Personal history of nicotine dependence: Secondary | ICD-10-CM | POA: Diagnosis not present

## 2023-06-18 DIAGNOSIS — Z880 Allergy status to penicillin: Secondary | ICD-10-CM | POA: Diagnosis not present

## 2023-06-18 DIAGNOSIS — Z9181 History of falling: Secondary | ICD-10-CM | POA: Diagnosis not present

## 2023-06-18 DIAGNOSIS — I10 Essential (primary) hypertension: Secondary | ICD-10-CM | POA: Diagnosis not present

## 2023-06-18 DIAGNOSIS — Z791 Long term (current) use of non-steroidal anti-inflammatories (NSAID): Secondary | ICD-10-CM | POA: Diagnosis not present

## 2023-06-18 DIAGNOSIS — E785 Hyperlipidemia, unspecified: Secondary | ICD-10-CM | POA: Diagnosis not present

## 2023-06-18 DIAGNOSIS — K219 Gastro-esophageal reflux disease without esophagitis: Secondary | ICD-10-CM | POA: Diagnosis not present

## 2023-06-18 DIAGNOSIS — Z008 Encounter for other general examination: Secondary | ICD-10-CM | POA: Diagnosis not present

## 2023-06-18 DIAGNOSIS — Z87892 Personal history of anaphylaxis: Secondary | ICD-10-CM | POA: Diagnosis not present

## 2023-06-18 DIAGNOSIS — L309 Dermatitis, unspecified: Secondary | ICD-10-CM | POA: Diagnosis not present

## 2023-07-30 DIAGNOSIS — Z6827 Body mass index (BMI) 27.0-27.9, adult: Secondary | ICD-10-CM | POA: Diagnosis not present

## 2023-07-30 DIAGNOSIS — E785 Hyperlipidemia, unspecified: Secondary | ICD-10-CM | POA: Diagnosis not present

## 2023-07-30 DIAGNOSIS — K219 Gastro-esophageal reflux disease without esophagitis: Secondary | ICD-10-CM | POA: Diagnosis not present

## 2023-07-30 DIAGNOSIS — I10 Essential (primary) hypertension: Secondary | ICD-10-CM | POA: Diagnosis not present

## 2023-07-30 DIAGNOSIS — L309 Dermatitis, unspecified: Secondary | ICD-10-CM | POA: Diagnosis not present

## 2023-07-30 DIAGNOSIS — F419 Anxiety disorder, unspecified: Secondary | ICD-10-CM | POA: Diagnosis not present

## 2023-07-30 DIAGNOSIS — E559 Vitamin D deficiency, unspecified: Secondary | ICD-10-CM | POA: Diagnosis not present

## 2023-07-30 DIAGNOSIS — G629 Polyneuropathy, unspecified: Secondary | ICD-10-CM | POA: Diagnosis not present

## 2023-11-05 DIAGNOSIS — E785 Hyperlipidemia, unspecified: Secondary | ICD-10-CM | POA: Diagnosis not present

## 2023-11-05 DIAGNOSIS — E559 Vitamin D deficiency, unspecified: Secondary | ICD-10-CM | POA: Diagnosis not present

## 2023-11-05 DIAGNOSIS — K219 Gastro-esophageal reflux disease without esophagitis: Secondary | ICD-10-CM | POA: Diagnosis not present

## 2023-11-05 DIAGNOSIS — F419 Anxiety disorder, unspecified: Secondary | ICD-10-CM | POA: Diagnosis not present

## 2023-11-05 DIAGNOSIS — I10 Essential (primary) hypertension: Secondary | ICD-10-CM | POA: Diagnosis not present

## 2023-11-05 DIAGNOSIS — L309 Dermatitis, unspecified: Secondary | ICD-10-CM | POA: Diagnosis not present

## 2023-11-05 DIAGNOSIS — Z6825 Body mass index (BMI) 25.0-25.9, adult: Secondary | ICD-10-CM | POA: Diagnosis not present

## 2023-11-05 DIAGNOSIS — R928 Other abnormal and inconclusive findings on diagnostic imaging of breast: Secondary | ICD-10-CM | POA: Diagnosis not present

## 2023-11-05 DIAGNOSIS — G629 Polyneuropathy, unspecified: Secondary | ICD-10-CM | POA: Diagnosis not present

## 2023-11-05 DIAGNOSIS — E538 Deficiency of other specified B group vitamins: Secondary | ICD-10-CM | POA: Diagnosis not present

## 2023-11-11 DIAGNOSIS — H524 Presbyopia: Secondary | ICD-10-CM | POA: Diagnosis not present

## 2023-11-20 DIAGNOSIS — N6313 Unspecified lump in the right breast, lower outer quadrant: Secondary | ICD-10-CM | POA: Diagnosis not present

## 2023-11-20 DIAGNOSIS — R928 Other abnormal and inconclusive findings on diagnostic imaging of breast: Secondary | ICD-10-CM | POA: Diagnosis not present

## 2023-11-20 DIAGNOSIS — R92321 Mammographic fibroglandular density, right breast: Secondary | ICD-10-CM | POA: Diagnosis not present
